# Patient Record
Sex: Female | Born: 1935 | Race: White | Hispanic: No | Smoking: Never smoker
Health system: Southern US, Community
[De-identification: ages and names within clinical notes are randomized; demographics above are authoritative.]

## PROBLEM LIST (undated history)

## (undated) DIAGNOSIS — G35 Multiple sclerosis: Secondary | ICD-10-CM

## (undated) DIAGNOSIS — R531 Weakness: Secondary | ICD-10-CM

## (undated) DIAGNOSIS — K741 Hepatic sclerosis: Secondary | ICD-10-CM

## (undated) DIAGNOSIS — E079 Disorder of thyroid, unspecified: Secondary | ICD-10-CM

## (undated) DIAGNOSIS — J45909 Unspecified asthma, uncomplicated: Secondary | ICD-10-CM

---

## 2010-08-26 ENCOUNTER — Emergency Department (HOSPITAL_BASED_OUTPATIENT_CLINIC_OR_DEPARTMENT_OTHER)
Admission: EM | Admit: 2010-08-26 | Discharge: 2010-08-26 | Payer: Self-pay | Source: Home / Self Care | Admitting: Emergency Medicine

## 2010-08-30 ENCOUNTER — Emergency Department (HOSPITAL_BASED_OUTPATIENT_CLINIC_OR_DEPARTMENT_OTHER)
Admission: EM | Admit: 2010-08-30 | Discharge: 2010-08-30 | Payer: Self-pay | Source: Home / Self Care | Admitting: Emergency Medicine

## 2012-05-05 ENCOUNTER — Emergency Department (HOSPITAL_BASED_OUTPATIENT_CLINIC_OR_DEPARTMENT_OTHER)
Admission: EM | Admit: 2012-05-05 | Discharge: 2012-05-05 | Disposition: A | Discharge status: Skilled Nursing Facility | Payer: Medicare Other | Attending: Emergency Medicine | Admitting: Emergency Medicine

## 2012-05-05 ENCOUNTER — Encounter (HOSPITAL_BASED_OUTPATIENT_CLINIC_OR_DEPARTMENT_OTHER): Payer: Self-pay | Admitting: *Deleted

## 2012-05-05 DIAGNOSIS — Z888 Allergy status to other drugs, medicaments and biological substances status: Secondary | ICD-10-CM | POA: Insufficient documentation

## 2012-05-05 DIAGNOSIS — T8389XA Other specified complication of genitourinary prosthetic devices, implants and grafts, initial encounter: Secondary | ICD-10-CM | POA: Insufficient documentation

## 2012-05-05 DIAGNOSIS — J45909 Unspecified asthma, uncomplicated: Secondary | ICD-10-CM | POA: Insufficient documentation

## 2012-05-05 DIAGNOSIS — Z882 Allergy status to sulfonamides status: Secondary | ICD-10-CM | POA: Insufficient documentation

## 2012-05-05 DIAGNOSIS — Z881 Allergy status to other antibiotic agents status: Secondary | ICD-10-CM | POA: Insufficient documentation

## 2012-05-05 DIAGNOSIS — T839XXA Unspecified complication of genitourinary prosthetic device, implant and graft, initial encounter: Secondary | ICD-10-CM

## 2012-05-05 DIAGNOSIS — G35 Multiple sclerosis: Secondary | ICD-10-CM | POA: Insufficient documentation

## 2012-05-05 HISTORY — DX: Hepatic sclerosis: K74.1

## 2012-05-05 HISTORY — DX: Weakness: R53.1

## 2012-05-05 HISTORY — DX: Unspecified asthma, uncomplicated: J45.909

## 2012-05-05 HISTORY — DX: Multiple sclerosis: G35

## 2012-05-05 HISTORY — DX: Disorder of thyroid, unspecified: E07.9

## 2012-05-05 LAB — URINALYSIS, MICROSCOPIC ONLY
Bilirubin Urine: NEGATIVE
Hgb urine dipstick: NEGATIVE
Nitrite: NEGATIVE
Protein, ur: NEGATIVE mg/dL
Urobilinogen, UA: 0.2 mg/dL (ref 0.0–1.0)

## 2012-05-05 NOTE — ED Notes (Addendum)
PT RESTING QUIETLY WITH NO C/O.

## 2012-05-05 NOTE — ED Provider Notes (Signed)
History     CSN: [id number]  Arrival date & time 05/05/12  1208   First MD Initiated Contact with Patient 05/05/12 1237      Chief Complaint  Patient presents with  . foley cath change, no c/o     (Consider location/radiation/quality/duration/timing/severity/associated sxs/prior treatment) HPI Pt presenting for change of her chronic indwelling foley catheter.  She has no symptoms.  No fever, no dificulty in drainage of urine.  However she states that her catheter has been in place for approx 2 months and has not been changed.  She states she had a recent hospitalization and got off track with home health who changes the catheter normally.  There are no other associated systemic symptoms, there are no other alleviating or modifying factors.   Past Medical History  Diagnosis Date  . Multiple sclerosis   . Hepatic sclerosis   . Weakness generalized   . Thyroid disease   . Asthma     History reviewed. No pertinent past surgical history.  History reviewed. No pertinent family history.  History  Substance Use Topics  . Smoking status: Never Smoker   . Smokeless tobacco: Not on file  . Alcohol Use: No    OB History    Grav Para Term Preterm Abortions TAB SAB Ect Mult Living                  Review of Systems ROS reviewed and all otherwise negative except for mentioned in HPI  Allergies  Macrobid; Septra; Sulfa antibiotics; Tetracyclines & related; and Tolmetin  Home Medications   Current Outpatient Rx  Name Route Sig Dispense Refill  . ALBUTEROL SULFATE HFA 108 (90 BASE) MCG/ACT IN AERS Inhalation Inhale 2 puffs into the lungs every 6 (six) hours as needed.    . AMIODARONE HCL 200 MG PO TABS Oral Take 200 mg by mouth daily.    . CELECOXIB 200 MG PO CAPS Oral Take 200 mg by mouth 2 (two) times daily.    Marland Kitchen DOCUSATE SODIUM 100 MG PO CAPS Oral Take 100 mg by mouth 2 (two) times daily.    Marland Kitchen FLUTICASONE PROPIONATE 50 MCG/ACT NA SUSP Nasal Place 2 sprays into the nose  daily.    . GUAIFENESIN 100 MG/5ML PO SYRP Oral Take 200 mg by mouth 3 (three) times daily as needed.    Marland Kitchen LEVOTHYROXINE SODIUM 100 MCG PO TABS Oral Take 100 mcg by mouth daily.    Marland Kitchen LEVOTHYROXINE SODIUM 75 MCG PO TABS Oral Take 75 mcg by mouth daily.    Marland Kitchen LORAZEPAM 0.5 MG PO TABS Oral Take 0.5 mg by mouth every 8 (eight) hours.    Marland Kitchen MIRTAZAPINE 30 MG PO TABS Oral Take 30 mg by mouth at bedtime.    Marland Kitchen MONTELUKAST SODIUM 10 MG PO TABS Oral Take 10 mg by mouth at bedtime.    . NYSTATIN 100000 UNIT/ML MT SUSP Oral Take 500,000 Units by mouth 4 (four) times daily.    Marland Kitchen OMEPRAZOLE 20 MG PO CPDR Oral Take 20 mg by mouth daily.    . OXYCODONE-ACETAMINOPHEN 5-325 MG PO TABS Oral Take 1 tablet by mouth every 4 (four) hours as needed.    . TRIAMTERENE-HCTZ 37.5-25 MG PO TABS Oral Take 1 tablet by mouth daily.    . WARFARIN SODIUM 3 MG PO TABS Oral Take 3 mg by mouth daily.      BP 114/68  Pulse 77  Temp 97.5 F (36.4 C) (Oral)  Resp 18  SpO2  98% Vitals reviewed Physical Exam Physical Examination: General appearance - alert, well appearing, and in no distress Mental status - alert, oriented to person, place, and time Chest - clear to auscultation, no wheezes, rales or rhonchi, symmetric air entry Heart - normal rate, regular rhythm, normal S1, S2, no murmurs, rubs, clicks or gallops Abdomen - soft, nontender, nondistended, no masses or organomegaly Genital- foley catheter in place, draining clear yellow urine  ED Course  Procedures (including critical care time)  Labs Reviewed  URINALYSIS, MICROSCOPIC ONLY - Abnormal; Notable for the following:    Leukocytes, UA SMALL (*)     Squamous Epithelial / LPF FEW (*)     All other components within normal limits  URINE CULTURE   No results found.   1. Foley catheter problem       MDM  Pt presenting with chronic indwelling catheter requesting that it needs to be changes as it has been in place for 2 months and should be changed monthly.   Urinalysis and urine culture sent.  Foley catheter changed without incident.  Discharged with strict return precautions.  Pt agreeable with plan.        Ethelda Chick, MD 05/11/12 1537

## 2012-05-05 NOTE — ED Notes (Signed)
Taryn, EMT unable to place foley x 2 attempts. Pt tolerated well.

## 2012-05-05 NOTE — ED Notes (Signed)
Pt to room 3 by ems via stretcher. Pt reports she is one month overdue for her foley change and the nurse at [facility] was unable to do it. Pt is awake and alert, denies any c/o. Foley is patent, putting out qs of clear yellow urine.

## 2012-05-08 LAB — URINE CULTURE: Colony Count: 35000

## 2012-05-09 NOTE — ED Notes (Signed)
+   Urine Chart sent to EDP office for review. 

## 2012-05-10 NOTE — ED Notes (Signed)
Chart returned from EDP office. No treatment needed at this time. Follow-up with her urologist. Reviewed by Roxy Horseman PA-C.

## 2012-08-29 ENCOUNTER — Encounter (HOSPITAL_BASED_OUTPATIENT_CLINIC_OR_DEPARTMENT_OTHER): Payer: Self-pay | Admitting: Emergency Medicine

## 2012-08-29 ENCOUNTER — Emergency Department (HOSPITAL_BASED_OUTPATIENT_CLINIC_OR_DEPARTMENT_OTHER)
Admission: EM | Admit: 2012-08-29 | Discharge: 2012-08-29 | Disposition: A | Discharge status: Home / Self Care | Payer: Medicare Other | Attending: Emergency Medicine | Admitting: Emergency Medicine

## 2012-08-29 DIAGNOSIS — G35 Multiple sclerosis: Secondary | ICD-10-CM | POA: Insufficient documentation

## 2012-08-29 DIAGNOSIS — T8389XA Other specified complication of genitourinary prosthetic devices, implants and grafts, initial encounter: Secondary | ICD-10-CM | POA: Insufficient documentation

## 2012-08-29 DIAGNOSIS — Z593 Problems related to living in residential institution: Secondary | ICD-10-CM | POA: Insufficient documentation

## 2012-08-29 DIAGNOSIS — N390 Urinary tract infection, site not specified: Secondary | ICD-10-CM | POA: Insufficient documentation

## 2012-08-29 DIAGNOSIS — J45909 Unspecified asthma, uncomplicated: Secondary | ICD-10-CM | POA: Insufficient documentation

## 2012-08-29 DIAGNOSIS — IMO0002 Reserved for concepts with insufficient information to code with codable children: Secondary | ICD-10-CM | POA: Insufficient documentation

## 2012-08-29 DIAGNOSIS — T839XXA Unspecified complication of genitourinary prosthetic device, implant and graft, initial encounter: Secondary | ICD-10-CM

## 2012-08-29 DIAGNOSIS — Z7901 Long term (current) use of anticoagulants: Secondary | ICD-10-CM | POA: Insufficient documentation

## 2012-08-29 DIAGNOSIS — Y838 Other surgical procedures as the cause of abnormal reaction of the patient, or of later complication, without mention of misadventure at the time of the procedure: Secondary | ICD-10-CM | POA: Insufficient documentation

## 2012-08-29 DIAGNOSIS — Z789 Other specified health status: Secondary | ICD-10-CM | POA: Insufficient documentation

## 2012-08-29 DIAGNOSIS — E079 Disorder of thyroid, unspecified: Secondary | ICD-10-CM | POA: Insufficient documentation

## 2012-08-29 DIAGNOSIS — K769 Liver disease, unspecified: Secondary | ICD-10-CM | POA: Insufficient documentation

## 2012-08-29 DIAGNOSIS — Z79899 Other long term (current) drug therapy: Secondary | ICD-10-CM | POA: Insufficient documentation

## 2012-08-29 LAB — URINALYSIS, MICROSCOPIC ONLY
Bilirubin Urine: NEGATIVE
Glucose, UA: NEGATIVE mg/dL
Ketones, ur: NEGATIVE mg/dL
pH: 6 (ref 5.0–8.0)

## 2012-08-29 MED ORDER — CEPHALEXIN 500 MG PO CAPS: 500.0000 mg | ORAL_CAPSULE | Freq: Four times a day (QID) | ORAL | Status: AC

## 2012-08-29 NOTE — ED Notes (Signed)
Sent here from nursing facility because foley came out

## 2012-08-29 NOTE — ED Provider Notes (Signed)
History     CSN: [id number]  Arrival date & time 08/29/12  0226   First MD Initiated Contact with Patient 08/29/12 [phone]      Chief Complaint  Patient presents with  . Urinary Retention    (Consider location/radiation/quality/duration/timing/severity/associated sxs/prior treatment) HPI Pt presenting from nursing home due to foley coming out.  Pt has chronic indwelling foley and states that it is replaced once per month.  She states it was replaced earlier today and she felt that it was not in properly.  It began leaking and then came out tonight.  She does feel the need to urinate.  No fever/chills, no vomiting, no abdominal pain.  She has no other complaints.  There are no other associated systemic symptoms, there are no other alleviating or modifying factors.   Past Medical History  Diagnosis Date  . Multiple sclerosis   . Hepatic sclerosis   . Weakness generalized   . Thyroid disease   . Asthma     History reviewed. No pertinent past surgical history.  No family history on file.  History  Substance Use Topics  . Smoking status: Never Smoker   . Smokeless tobacco: Not on file  . Alcohol Use: No    OB History    Grav Para Term Preterm Abortions TAB SAB Ect Mult Living                  Review of Systems ROS reviewed and all otherwise negative except for mentioned in HPI  Allergies  Macrobid; Septra; Sulfa antibiotics; Tetracyclines & related; and Tolmetin  Home Medications   Current Outpatient Rx  Name  Route  Sig  Dispense  Refill  . ALBUTEROL SULFATE HFA 108 (90 BASE) MCG/ACT IN AERS   Inhalation   Inhale 2 puffs into the lungs every 6 (six) hours as needed.         . AMIODARONE HCL 200 MG PO TABS   Oral   Take 200 mg by mouth daily.         . CELECOXIB 200 MG PO CAPS   Oral   Take 200 mg by mouth 2 (two) times daily.         . CEPHALEXIN 500 MG PO CAPS   Oral   Take 1 capsule (500 mg total) by mouth 4 (four) times daily.   40 capsule   0   . DOCUSATE SODIUM 100 MG PO CAPS   Oral   Take 100 mg by mouth 2 (two) times daily.         Marland Kitchen FLUTICASONE PROPIONATE 50 MCG/ACT NA SUSP   Nasal   Place 2 sprays into the nose daily.         . GUAIFENESIN 100 MG/5ML PO SYRP   Oral   Take 200 mg by mouth 3 (three) times daily as needed.         Marland Kitchen LEVOTHYROXINE SODIUM 100 MCG PO TABS   Oral   Take 100 mcg by mouth daily.         Marland Kitchen LEVOTHYROXINE SODIUM 75 MCG PO TABS   Oral   Take 75 mcg by mouth daily.         Marland Kitchen LORAZEPAM 0.5 MG PO TABS   Oral   Take 0.5 mg by mouth every 8 (eight) hours.         Marland Kitchen MIRTAZAPINE 30 MG PO TABS   Oral   Take 30 mg by mouth at bedtime.         Marland Kitchen  MONTELUKAST SODIUM 10 MG PO TABS   Oral   Take 10 mg by mouth at bedtime.         . NYSTATIN 100000 UNIT/ML MT SUSP   Oral   Take 500,000 Units by mouth 4 (four) times daily.         Marland Kitchen OMEPRAZOLE 20 MG PO CPDR   Oral   Take 20 mg by mouth daily.         . OXYCODONE-ACETAMINOPHEN 5-325 MG PO TABS   Oral   Take 1 tablet by mouth every 4 (four) hours as needed.         . TRIAMTERENE-HCTZ 37.5-25 MG PO TABS   Oral   Take 1 tablet by mouth daily.         . WARFARIN SODIUM 3 MG PO TABS   Oral   Take 3 mg by mouth daily.           BP 119/59  Pulse 74  Temp 98.3 F (36.8 C) (Oral)  Resp 18  SpO2 94% Vitals reviewed Physical Exam Physical Examination: General appearance - alert, well appearing, and in no distress Mental status - alert, oriented to person, place, and time Mouth - mucous membranes moist, pharynx normal without lesions Chest - clear to auscultation, no wheezes, rales or rhonchi, symmetric air entry Heart - normal rate, regular rhythm, normal S1, S2, no murmurs, rubs, clicks or gallops Abdomen - soft, nontender, nondistended, no masses or organomegaly Extremities - peripheral pulses normal, no pedal edema, no clubbing or cyanosis Skin - normal coloration and turgor, no rashes, no suspicious skin  lesions noted  ED Course  Procedures (including critical care time)  Labs Reviewed  URINALYSIS, MICROSCOPIC ONLY - Abnormal; Notable for the following:    Nitrite POSITIVE (*)     Leukocytes, UA MODERATE (*)     Bacteria, UA MANY (*)     All other components within normal limits  URINE CULTURE   No results found.   1. Foley catheter problem   2. Urinary tract infection       MDM  Pt presenting with foley having come out.  She has no current symptoms.  Urinalysis and culture sent.  Urine is positive for nitrites- will start on keflex.  Foley was replaced.  Discharged with strict return precautions.  Pt agreeable with plan.        Ethelda Chick, MD 08/29/12 [phone]

## 2012-08-30 LAB — URINE CULTURE

## 2012-09-08 ENCOUNTER — Encounter (HOSPITAL_BASED_OUTPATIENT_CLINIC_OR_DEPARTMENT_OTHER): Payer: Self-pay | Admitting: Emergency Medicine

## 2012-09-08 ENCOUNTER — Emergency Department (HOSPITAL_BASED_OUTPATIENT_CLINIC_OR_DEPARTMENT_OTHER)
Admission: EM | Admit: 2012-09-08 | Discharge: 2012-09-08 | Disposition: A | Discharge status: Skilled Nursing Facility | Payer: Medicare Other | Attending: Emergency Medicine | Admitting: Emergency Medicine

## 2012-09-08 DIAGNOSIS — Z8719 Personal history of other diseases of the digestive system: Secondary | ICD-10-CM | POA: Insufficient documentation

## 2012-09-08 DIAGNOSIS — Y838 Other surgical procedures as the cause of abnormal reaction of the patient, or of later complication, without mention of misadventure at the time of the procedure: Secondary | ICD-10-CM | POA: Insufficient documentation

## 2012-09-08 DIAGNOSIS — J45909 Unspecified asthma, uncomplicated: Secondary | ICD-10-CM | POA: Insufficient documentation

## 2012-09-08 DIAGNOSIS — Z7901 Long term (current) use of anticoagulants: Secondary | ICD-10-CM | POA: Insufficient documentation

## 2012-09-08 DIAGNOSIS — T839XXA Unspecified complication of genitourinary prosthetic device, implant and graft, initial encounter: Secondary | ICD-10-CM

## 2012-09-08 DIAGNOSIS — T83091A Other mechanical complication of indwelling urethral catheter, initial encounter: Secondary | ICD-10-CM | POA: Insufficient documentation

## 2012-09-08 DIAGNOSIS — Z79899 Other long term (current) drug therapy: Secondary | ICD-10-CM | POA: Insufficient documentation

## 2012-09-08 DIAGNOSIS — Z593 Problems related to living in residential institution: Secondary | ICD-10-CM | POA: Insufficient documentation

## 2012-09-08 DIAGNOSIS — Z789 Other specified health status: Secondary | ICD-10-CM | POA: Insufficient documentation

## 2012-09-08 DIAGNOSIS — G35 Multiple sclerosis: Secondary | ICD-10-CM | POA: Insufficient documentation

## 2012-09-08 DIAGNOSIS — E079 Disorder of thyroid, unspecified: Secondary | ICD-10-CM | POA: Insufficient documentation

## 2012-09-08 LAB — URINALYSIS, MICROSCOPIC ONLY
Bilirubin Urine: NEGATIVE
Glucose, UA: NEGATIVE mg/dL
Ketones, ur: NEGATIVE mg/dL
pH: 5 (ref 5.0–8.0)

## 2012-09-08 NOTE — ED Notes (Signed)
Reports foley cath came out denies pull or incident that dislodged

## 2012-09-08 NOTE — ED Notes (Signed)
MD at bedside. 

## 2012-09-08 NOTE — ED Notes (Signed)
Call placed for pt transport to [facility].

## 2012-09-08 NOTE — ED Provider Notes (Signed)
History     CSN: [id number]  Arrival date & time 09/08/12  0103   First MD Initiated Contact with Patient 09/08/12 0122      Chief Complaint  Patient presents with  . Illegal value: [    Foley catheter dislodge     HPI Foley catheter problem Onset - just prior to arrival Course - stable Improved by - nothing Worsened by - nothing  Pt reports she was getting up to have bowel movement and her foley catheter dislodged.  She denies abd pain . She has no new weakness (she has h/o MS) She denies fever/vomiting.  She denies any hematuria She reports she has indwelling catheter for multiple sclerosis  Past Medical History  Diagnosis Date  . Multiple sclerosis   . Hepatic sclerosis   . Weakness generalized   . Thyroid disease   . Asthma     History reviewed. No pertinent past surgical history.  History reviewed. No pertinent family history.  History  Substance Use Topics  . Smoking status: Never Smoker   . Smokeless tobacco: Not on file  . Alcohol Use: No    OB History    Grav Para Term Preterm Abortions TAB SAB Ect Mult Living                  Review of Systems  Constitutional: Negative for fever.  Gastrointestinal: Negative for vomiting.    Allergies  Macrobid; Septra; Sulfa antibiotics; Tetracyclines & related; and Tolmetin  Home Medications   Current Outpatient Rx  Name  Route  Sig  Dispense  Refill  . ALBUTEROL SULFATE HFA 108 (90 BASE) MCG/ACT IN AERS   Inhalation   Inhale 2 puffs into the lungs every 6 (six) hours as needed.         . AMIODARONE HCL 200 MG PO TABS   Oral   Take 200 mg by mouth daily.         . CELECOXIB 200 MG PO CAPS   Oral   Take 200 mg by mouth 2 (two) times daily.         . CEPHALEXIN 500 MG PO CAPS   Oral   Take 1 capsule (500 mg total) by mouth 4 (four) times daily.   40 capsule   0   . DOCUSATE SODIUM 100 MG PO CAPS   Oral   Take 100 mg by mouth 2 (two) times daily.         Marland Kitchen FLUTICASONE PROPIONATE 50  MCG/ACT NA SUSP   Nasal   Place 2 sprays into the nose daily.         . GUAIFENESIN 100 MG/5ML PO SYRP   Oral   Take 200 mg by mouth 3 (three) times daily as needed.         Marland Kitchen LEVOTHYROXINE SODIUM 100 MCG PO TABS   Oral   Take 100 mcg by mouth daily.         Marland Kitchen LEVOTHYROXINE SODIUM 75 MCG PO TABS   Oral   Take 75 mcg by mouth daily.         Marland Kitchen LORAZEPAM 0.5 MG PO TABS   Oral   Take 0.5 mg by mouth every 8 (eight) hours.         Marland Kitchen MIRTAZAPINE 30 MG PO TABS   Oral   Take 30 mg by mouth at bedtime.         Marland Kitchen MONTELUKAST SODIUM 10 MG PO TABS   Oral  Take 10 mg by mouth at bedtime.         . NYSTATIN 100000 UNIT/ML MT SUSP   Oral   Take 500,000 Units by mouth 4 (four) times daily.         Marland Kitchen OMEPRAZOLE 20 MG PO CPDR   Oral   Take 20 mg by mouth daily.         . OXYCODONE-ACETAMINOPHEN 5-325 MG PO TABS   Oral   Take 1 tablet by mouth every 4 (four) hours as needed.         . TRIAMTERENE-HCTZ 37.5-25 MG PO TABS   Oral   Take 1 tablet by mouth daily.         . WARFARIN SODIUM 3 MG PO TABS   Oral   Take 3 mg by mouth daily.           BP 127/64  Pulse 76  Temp 98.7 F (37.1 C) (Oral)  Resp 20  SpO2 98%  Physical Exam CONSTITUTIONAL: Well developed/well nourished HEAD AND FACE: Normocephalic/atraumatic EYES: EOMI/PERRL ENMT: Mucous membranes moist NECK: supple no meningeal signs LUNGS:  no apparent distress ABDOMEN: soft, nontender, no rebound or guarding NEURO: Pt is awake/alert, moves all extremitiesx4 EXTREMITIES: pulses normal, full ROM SKIN: warm, color normal PSYCH: no abnormalities of mood noted  ED Course  Procedures   Labs Reviewed  URINALYSIS, MICROSCOPIC ONLY - Abnormal; Notable for the following:    Leukocytes, UA TRACE (*)     Squamous Epithelial / LPF FEW (*)     All other components within normal limits  URINE CULTURE   No results found.   1. Foley catheter problem    Nurse replaced foley cathetr.  Urine  sent for culture.  She is already on keflex.  Defer further treamtentt Stable for d/c  MDM  Nursing notes including past medical history and social history reviewed and considered in documentation Labs/vital reviewed and considered Previous records reviewed and considered - recent ED visit for foley catheter displacement         Joya Gaskins, MD 09/08/12 [phone]

## 2012-09-08 NOTE — ED Notes (Signed)
Pt has foley catheter as result of MS catheter came dislodged needs replaced

## 2012-09-09 LAB — URINE CULTURE

## 2014-09-30 ENCOUNTER — Emergency Department (HOSPITAL_BASED_OUTPATIENT_CLINIC_OR_DEPARTMENT_OTHER): Payer: Medicare Other

## 2014-09-30 ENCOUNTER — Emergency Department (HOSPITAL_BASED_OUTPATIENT_CLINIC_OR_DEPARTMENT_OTHER)
Admission: EM | Admit: 2014-09-30 | Discharge: 2014-09-30 | Disposition: A | Discharge status: Other Acute Inpatient Hospital | Payer: Medicare Other | Attending: Emergency Medicine | Admitting: Emergency Medicine

## 2014-09-30 ENCOUNTER — Encounter (HOSPITAL_BASED_OUTPATIENT_CLINIC_OR_DEPARTMENT_OTHER): Payer: Self-pay

## 2014-09-30 DIAGNOSIS — R4182 Altered mental status, unspecified: Secondary | ICD-10-CM | POA: Insufficient documentation

## 2014-09-30 DIAGNOSIS — I959 Hypotension, unspecified: Secondary | ICD-10-CM | POA: Insufficient documentation

## 2014-09-30 DIAGNOSIS — Z7901 Long term (current) use of anticoagulants: Secondary | ICD-10-CM | POA: Insufficient documentation

## 2014-09-30 DIAGNOSIS — N19 Unspecified kidney failure: Secondary | ICD-10-CM | POA: Insufficient documentation

## 2014-09-30 DIAGNOSIS — R404 Transient alteration of awareness: Secondary | ICD-10-CM

## 2014-09-30 DIAGNOSIS — E079 Disorder of thyroid, unspecified: Secondary | ICD-10-CM | POA: Diagnosis not present

## 2014-09-30 DIAGNOSIS — Z7951 Long term (current) use of inhaled steroids: Secondary | ICD-10-CM | POA: Insufficient documentation

## 2014-09-30 DIAGNOSIS — R05 Cough: Secondary | ICD-10-CM | POA: Diagnosis not present

## 2014-09-30 DIAGNOSIS — Z792 Long term (current) use of antibiotics: Secondary | ICD-10-CM | POA: Diagnosis not present

## 2014-09-30 DIAGNOSIS — Z79899 Other long term (current) drug therapy: Secondary | ICD-10-CM | POA: Insufficient documentation

## 2014-09-30 DIAGNOSIS — G35 Multiple sclerosis: Secondary | ICD-10-CM | POA: Diagnosis not present

## 2014-09-30 DIAGNOSIS — R059 Cough, unspecified: Secondary | ICD-10-CM

## 2014-09-30 DIAGNOSIS — Z791 Long term (current) use of non-steroidal anti-inflammatories (NSAID): Secondary | ICD-10-CM | POA: Diagnosis not present

## 2014-09-30 DIAGNOSIS — J45909 Unspecified asthma, uncomplicated: Secondary | ICD-10-CM | POA: Diagnosis not present

## 2014-09-30 LAB — CBC WITH DIFFERENTIAL/PLATELET
Basophils Absolute: 0 10*3/uL (ref 0.0–0.1)
Basophils Relative: 0 % (ref 0–1)
Eosinophils Absolute: 0.4 10*3/uL (ref 0.0–0.7)
Eosinophils Relative: 4 % (ref 0–5)
HCT: 37.4 % (ref 36.0–46.0)
HEMOGLOBIN: 11.5 g/dL — AB (ref 12.0–15.0)
LYMPHS ABS: 1.5 10*3/uL (ref 0.7–4.0)
LYMPHS PCT: 15 % (ref 12–46)
MCH: 28.1 pg (ref 26.0–34.0)
MCHC: 30.7 g/dL (ref 30.0–36.0)
MCV: 91.4 fL (ref 78.0–100.0)
MONOS PCT: 7 % (ref 3–12)
Monocytes Absolute: 0.7 10*3/uL (ref 0.1–1.0)
NEUTROS ABS: 7.3 10*3/uL (ref 1.7–7.7)
NEUTROS PCT: 74 % (ref 43–77)
PLATELETS: 219 10*3/uL (ref 150–400)
RBC: 4.09 MIL/uL (ref 3.87–5.11)
RDW: 15.7 % — ABNORMAL HIGH (ref 11.5–15.5)
WBC: 9.9 10*3/uL (ref 4.0–10.5)

## 2014-09-30 LAB — COMPREHENSIVE METABOLIC PANEL
ALT: 10 U/L (ref 0–35)
ANION GAP: 3 — AB (ref 5–15)
AST: 20 U/L (ref 0–37)
Albumin: 3 g/dL — ABNORMAL LOW (ref 3.5–5.2)
Alkaline Phosphatase: 95 U/L (ref 39–117)
BUN: 41 mg/dL — AB (ref 6–23)
CALCIUM: 8.5 mg/dL (ref 8.4–10.5)
CO2: 26 mmol/L (ref 19–32)
CREATININE: 2.05 mg/dL — AB (ref 0.50–1.10)
Chloride: 106 mmol/L (ref 96–112)
GFR calc non Af Amer: 20 mL/min — ABNORMAL LOW (ref >90)
GFR, EST AFRICAN AMERICAN: 23 mL/min — AB (ref >90)
GLUCOSE: 88 mg/dL (ref 70–99)
Potassium: 5.2 mmol/L — ABNORMAL HIGH (ref 3.5–5.1)
SODIUM: 135 mmol/L (ref 135–145)
TOTAL PROTEIN: 6.4 g/dL (ref 6.0–8.3)
Total Bilirubin: 0.4 mg/dL (ref 0.3–1.2)

## 2014-09-30 LAB — URINALYSIS, ROUTINE W REFLEX MICROSCOPIC
Bilirubin Urine: NEGATIVE
GLUCOSE, UA: NEGATIVE mg/dL
KETONES UR: 15 mg/dL — AB
Nitrite: POSITIVE — AB
PROTEIN: 30 mg/dL — AB
SPECIFIC GRAVITY, URINE: 1.021 (ref 1.005–1.030)
Urobilinogen, UA: 0.2 mg/dL (ref 0.0–1.0)
pH: 5 (ref 5.0–8.0)

## 2014-09-30 LAB — URINE MICROSCOPIC-ADD ON

## 2014-09-30 LAB — PROTIME-INR
INR: 3.19 — ABNORMAL HIGH (ref 0.00–1.49)
Prothrombin Time: 32.7 seconds — ABNORMAL HIGH (ref 11.6–15.2)

## 2014-09-30 LAB — TROPONIN I

## 2014-09-30 LAB — I-STAT CG4 LACTIC ACID, ED: LACTIC ACID, VENOUS: 0.68 mmol/L (ref 0.5–2.0)

## 2014-09-30 MED ORDER — SODIUM CHLORIDE 0.9 % IV BOLUS (SEPSIS)
500.0000 mL | Freq: Once | INTRAVENOUS | Status: AC
  Administered 2014-09-30: 1000 mL via INTRAVENOUS

## 2014-09-30 MED ORDER — DEXTROSE 5 % IV SOLN
1.0000 g | INTRAVENOUS | Status: DC
  Administered 2014-09-30: 1 g via INTRAVENOUS

## 2014-09-30 MED ORDER — CEFTRIAXONE SODIUM 1 G IJ SOLR
INTRAMUSCULAR | Status: AC
Start: 2014-09-30 — End: 2014-09-30
  Filled 2014-09-30: qty 10

## 2014-09-30 MED ORDER — SODIUM CHLORIDE 0.9 % IV SOLN
INTRAVENOUS | Status: DC
Start: 2014-09-30 — End: 2014-09-30

## 2014-09-30 MED ORDER — SODIUM CHLORIDE 0.9 % IV BOLUS (SEPSIS)
250.0000 mL | Freq: Once | INTRAVENOUS | Status: AC
  Administered 2014-09-30: 1000 mL via INTRAVENOUS

## 2014-09-30 MED ORDER — SODIUM CHLORIDE 0.9 % IV SOLN: INTRAVENOUS | Status: DC

## 2014-09-30 NOTE — ED Notes (Signed)
Indwelling foley cath in place with amber urine in bag.  Swelling not bilateral hands and wrist.  Skin is dry and flakey, oral mucosa dry. Stage II decubitus on sacrum with wound dressing in place.

## 2014-09-30 NOTE — ED Notes (Signed)
Received call from Meagan, [employer] at [facility] is working on getting pt an admn bed

## 2014-09-30 NOTE — ED Notes (Signed)
MD at bedside. 

## 2014-09-30 NOTE — ED Provider Notes (Addendum)
CSN: [id number]     Arrival date & time 09/30/14  1024 History   First MD Initiated Contact with Patient 09/30/14 1028     Chief Complaint  Patient presents with  . Altered Mental Status     (Consider location/radiation/quality/duration/timing/severity/associated sxs/prior Treatment) HPI Comments: Patient here with altered mental status according to nursing home staff. Patient has not been behaving additionally does. She is on home oxygen and was found to be hypoxic there but there was a mechanicalissue with the delivery with the oxygen. Noreported history of fever, cough, urinary symptoms. According to EMS patient has become more alert during transport. Patient transported for further evaluation  Patient is a 81 y.o. female presenting with altered mental status. The history is provided by the patient, medical records and the EMS personnel. The history is limited by the condition of the patient.  Altered Mental Status   Past Medical History  Diagnosis Date  . Multiple sclerosis   . Hepatic sclerosis   . Weakness generalized   . Thyroid disease   . Asthma    History reviewed. No pertinent past surgical history. No family history on file. History  Substance Use Topics  . Smoking status: Never Smoker   . Smokeless tobacco: Not on file  . Alcohol Use: No   OB History    No data available     Review of Systems  Unable to perform ROS     Allergies  Macrobid; Septra; Sulfa antibiotics; Tetracyclines & related; and Tolmetin  Home Medications   Prior to Admission medications   Medication Sig Start Date End Date Taking? Authorizing Provider  albuterol (PROVENTIL HFA;VENTOLIN HFA) 108 (90 BASE) MCG/ACT inhaler Inhale 2 puffs into the lungs every 6 (six) hours as needed.    Historical Provider, MD  amiodarone (PACERONE) 200 MG tablet Take 200 mg by mouth daily.    Historical Provider, MD  celecoxib (CELEBREX) 200 MG capsule Take 200 mg by mouth 2 (two) times daily.     Historical Provider, MD  cephALEXin (KEFLEX) 500 MG capsule Take 1 capsule (500 mg total) by mouth 4 (four) times daily. 08/29/12   Ethelda Chick, MD  docusate sodium (COLACE) 100 MG capsule Take 100 mg by mouth 2 (two) times daily.    Historical Provider, MD  fluticasone (FLONASE) 50 MCG/ACT nasal spray Place 2 sprays into the nose daily.    Historical Provider, MD  guaifenesin (ROBITUSSIN) 100 MG/5ML syrup Take 200 mg by mouth 3 (three) times daily as needed.    Historical Provider, MD  levothyroxine (SYNTHROID, LEVOTHROID) 100 MCG tablet Take 100 mcg by mouth daily.    Historical Provider, MD  levothyroxine (SYNTHROID, LEVOTHROID) 75 MCG tablet Take 75 mcg by mouth daily.    Historical Provider, MD  LORazepam (ATIVAN) 0.5 MG tablet Take 0.5 mg by mouth every 8 (eight) hours.    Historical Provider, MD  mirtazapine (REMERON) 30 MG tablet Take 30 mg by mouth at bedtime.    Historical Provider, MD  montelukast (SINGULAIR) 10 MG tablet Take 10 mg by mouth at bedtime.    Historical Provider, MD  nystatin (MYCOSTATIN) 100000 UNIT/ML suspension Take 500,000 Units by mouth 4 (four) times daily.    Historical Provider, MD  omeprazole (PRILOSEC) 20 MG capsule Take 20 mg by mouth daily.    Historical Provider, MD  oxyCODONE-acetaminophen (PERCOCET/ROXICET) 5-325 MG per tablet Take 1 tablet by mouth every 4 (four) hours as needed.    Historical Provider, MD  triamterene-hydrochlorothiazide [facility]) 37.5-25  MG per tablet Take 1 tablet by mouth daily.    Historical Provider, MD  warfarin (COUMADIN) 3 MG tablet Take 3 mg by mouth daily.    Historical Provider, MD   BP 98/41 mmHg  Pulse 73  Temp(Src) 97.6 F (36.4 C) (Rectal)  Resp 18  Wt 150 lb (68.04 kg)  SpO2 99% Physical Exam  Constitutional: She appears well-developed and well-nourished.  Non-toxic appearance. No distress.  HENT:  Head: Normocephalic and atraumatic.  Eyes: Conjunctivae, EOM and lids are normal. Pupils are equal, round, and  reactive to light.  Neck: Normal range of motion. Neck supple. No tracheal deviation present. No thyroid mass present.  Cardiovascular: Normal rate, regular rhythm and normal heart sounds.  Exam reveals no gallop.   No murmur heard. Pulmonary/Chest: Effort normal and breath sounds normal. No stridor. No respiratory distress. She has no decreased breath sounds. She has no wheezes. She has no rhonchi. She has no rales.  Abdominal: Soft. Normal appearance and bowel sounds are normal. She exhibits no distension. There is no tenderness. There is no rebound and no CVA tenderness.  Musculoskeletal: Normal range of motion. She exhibits no edema or tenderness.  Neurological: She is alert. No cranial nerve deficit or sensory deficit. GCS eye subscore is 4. GCS verbal subscore is 4. GCS motor subscore is 6.  Upper extremity strength normal bilaterally. 3 of 5 strength noted bilateral lower extremities which is baseline according to the records  Skin: Skin is warm and dry. No abrasion and no rash noted.  Psychiatric: Her affect is blunt. Her speech is delayed. She is slowed.  Nursing note and vitals reviewed.   ED Course  Procedures (including critical care time) Labs Review Labs Reviewed  URINE CULTURE  CULTURE, BLOOD (ROUTINE X 2)  CULTURE, BLOOD (ROUTINE X 2)  CBC WITH DIFFERENTIAL/PLATELET  COMPREHENSIVE METABOLIC PANEL  URINALYSIS, ROUTINE W REFLEX MICROSCOPIC  BRAIN NATRIURETIC PEPTIDE  TROPONIN I  PROTIME-INR  I-STAT CG4 LACTIC ACID, ED    Imaging Review No results found.   EKG Interpretation None      MDM   Final diagnoses:  Cough  Altered consciousness    Patient hypotensive here and given IV fluids. Urinalysis shows infection and started on Rocephin. Blood pressure has been responsive to IV fluids. Patient has evidence of early renal failure on her Bmet. She is mentating appropriately. Will admit to [facility] regional per the family's request   2:14 PM Patient  rechecked and blood pressure on the monitor was 93/45. She is maintaining appropriate. Spoke with the hospitalist at [facility], Dr.nsiah, patient will be sent to the ER to be seen by ed physician   CRITICAL CARE Performed by: Toy Baker Total critical care time: 50 Critical care time was exclusive of separately billable procedures and treating other patients. Critical care was necessary to treat or prevent imminent or life-threatening deterioration. Critical care was time spent personally by me on the following activities: development of treatment plan with patient and/or surrogate as well as nursing, discussions with consultants, evaluation of patient's response to treatment, examination of patient, obtaining history from patient or surrogate, ordering and performing treatments and interventions, ordering and review of laboratory studies, ordering and review of radiographic studies, pulse oximetry and re-evaluation of patient's condition.    Toy Baker, MD 09/30/14 1326  Toy Baker, MD 09/30/14 [phone]

## 2014-09-30 NOTE — ED Notes (Signed)
EDP [facility] notified of BP-orders for NS 250cc bolus

## 2014-09-30 NOTE — ED Notes (Signed)
Change in LOC per nursing home staff.  States pt has a decreased appetite and not behaving normally.  PTAR reports SAO2 82% on 2L, however [place] not in place correctly.

## 2014-09-30 NOTE — ED Notes (Signed)
Pt sats dropped to 85% sleeping-mouth breathing-EDP and RRT notified-RRT at [facility] to change O2 to venti mask

## 2014-09-30 NOTE — ED Notes (Signed)
EDP notified of no change with 250cc bolus-orders for 500cc bolus

## 2014-10-02 LAB — URINE CULTURE: Colony Count: >=100000

## 2014-10-02 LAB — CULTURE, BLOOD (ROUTINE X 2)

## 2014-10-03 ENCOUNTER — Telehealth (HOSPITAL_BASED_OUTPATIENT_CLINIC_OR_DEPARTMENT_OTHER): Payer: Self-pay | Admitting: Emergency Medicine

## 2014-10-03 NOTE — Telephone Encounter (Signed)
Post ED Visit - Positive Culture Follow-up  Culture report reviewed by antimicrobial stewardship pharmacist: []  Wes Dulaney, Pharm.D., BCPS [x]  Celedonio Miyamoto, Pharm.D., BCPS []  Georgina Pillion, Pharm.D., BCPS []  [place], [address].D., BCPS, AAHIVP []  Estella Husk, Pharm.D., BCPS, AAHIVP []  Elder Cyp[place], [address].D., BCPS  Positive urine culture E.Coli Blood culture Staph Pt is inpatient @ [facility], report faxed to [phone] per PharmD request  Berle Mull 10/03/2014, 11:29 AM

## 2014-10-06 LAB — CULTURE, BLOOD (ROUTINE X 2): Culture: NO GROWTH

## 2016-03-12 DEATH — deceased

## 2016-06-01 IMAGING — CT CT HEAD W/O CM
1 series · 16 of 30 positions shown, 20 images · non-contrast
Comparison: 06/17/2014 CT.  05/24/2009 MR.

CLINICAL DATA: Altered level of consciousness of uncertain
duration. Initial encounter.

EXAM:
CT HEAD WITHOUT CONTRAST
TECHNIQUE: Contiguous axial images were obtained from the base of the skull
through the vertex without intravenous contrast.

[Series 2: head 4.8 h37s · axial · 0.42mm/px · z∈[-168,-28]mm · 16 of 32 slices shown, 20 images]
[im 2/32  brain]
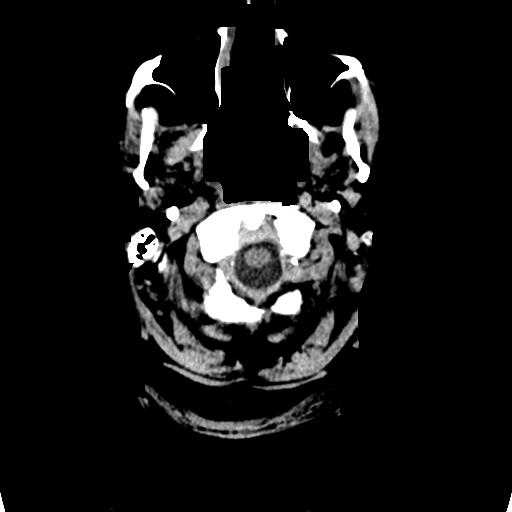
[im 2/32  bone]
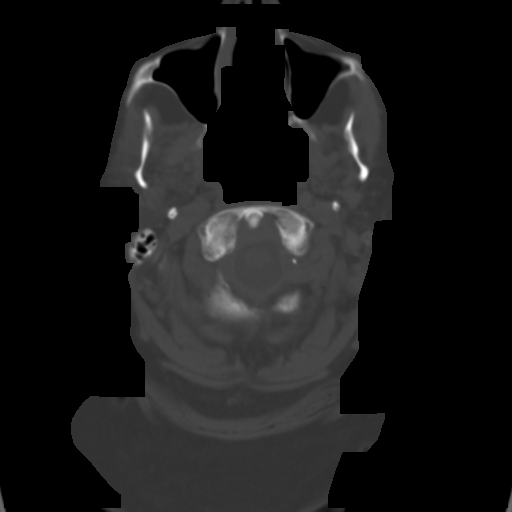
[im 4/32  brain]
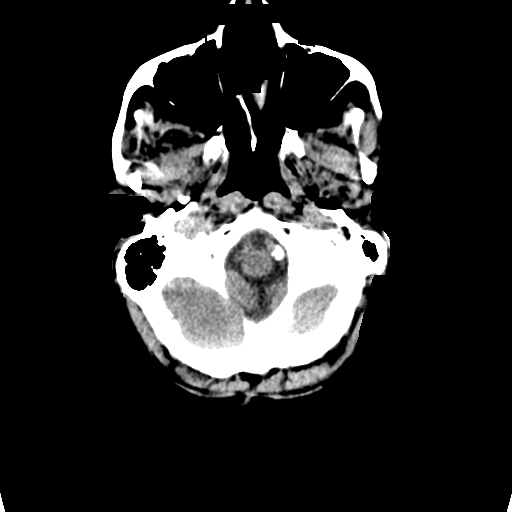
[im 6/32  brain]
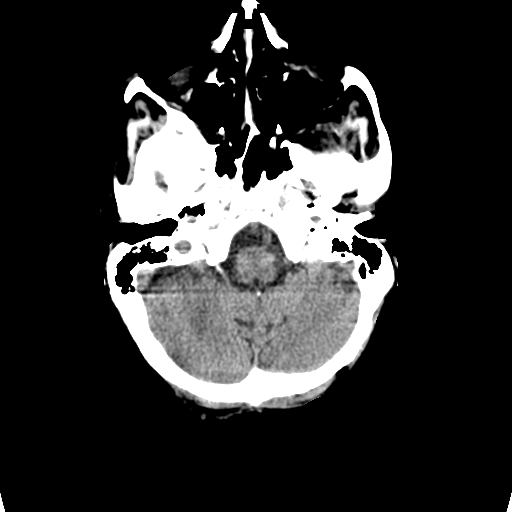
[im 8/32  brain]
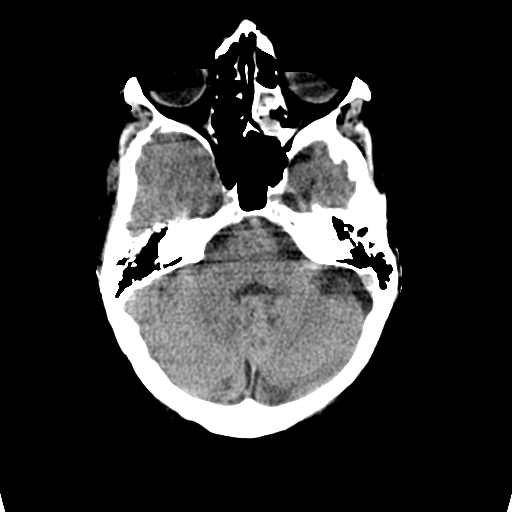
[im 9/32  brain]
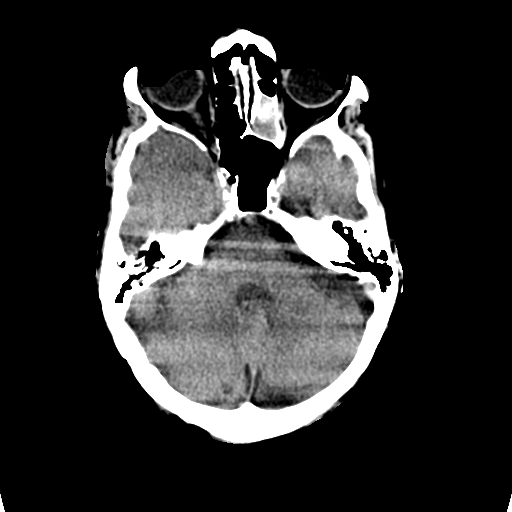
[im 9/32  bone]
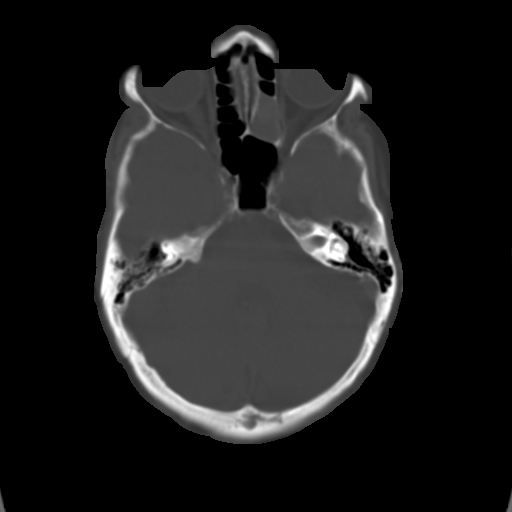
[im 11/32  brain]
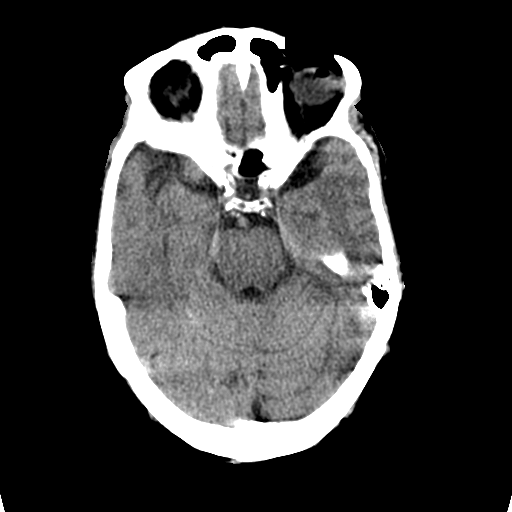
[im 13/32  brain]
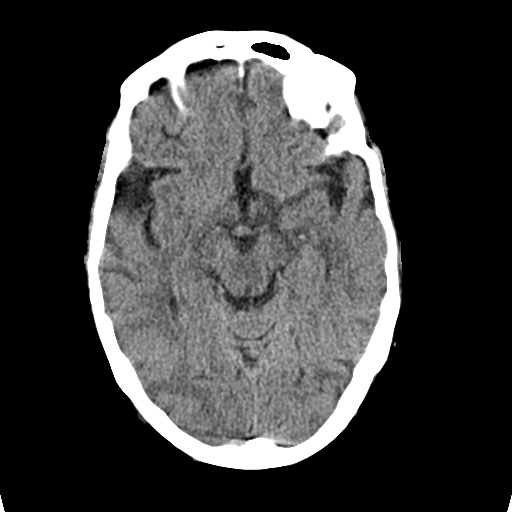
[im 15/32  brain]
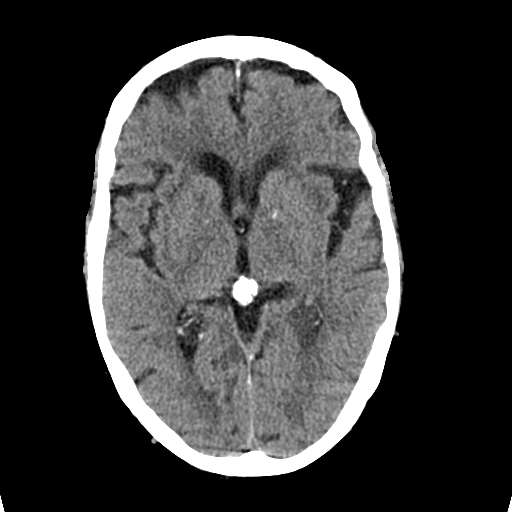
[im 17/32  brain]
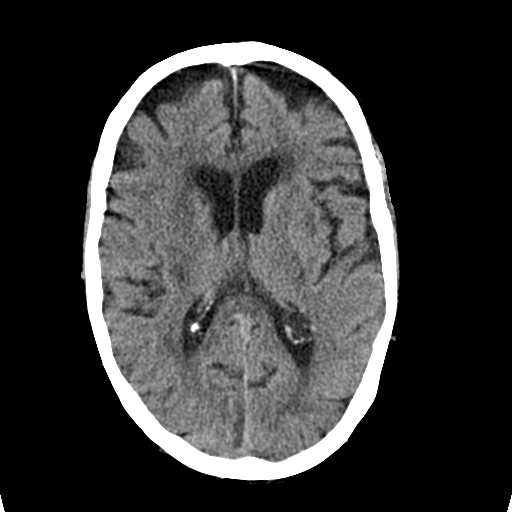
[im 17/32  bone]
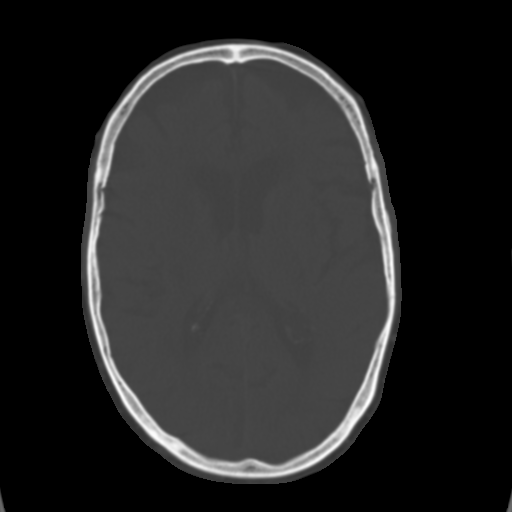
[im 19/32  brain]
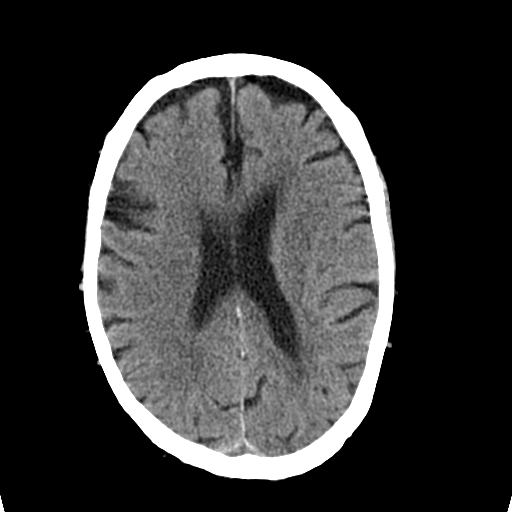
[im 21/32  brain]
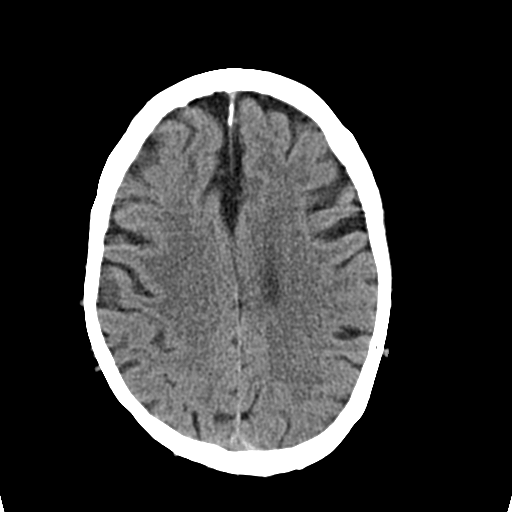
[im 23/32  brain]
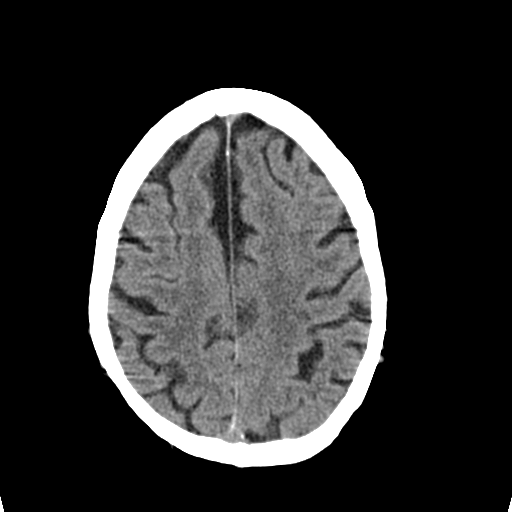
[im 24/32  brain]
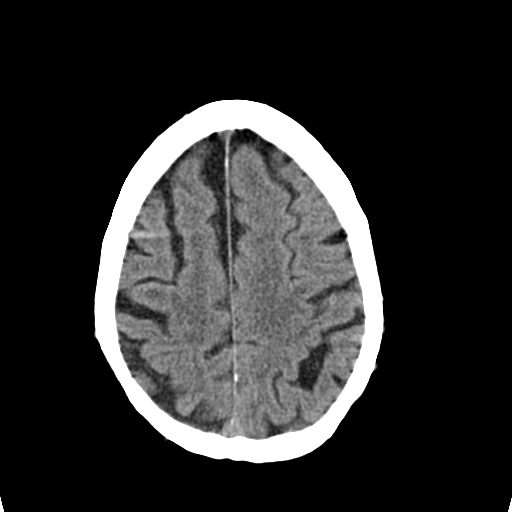
[im 24/32  bone]
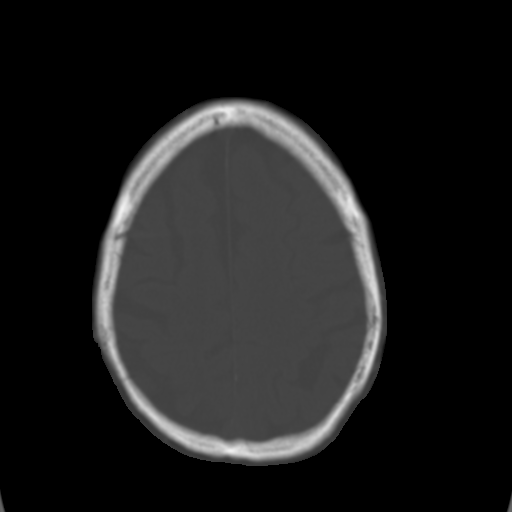
[im 26/32  brain]
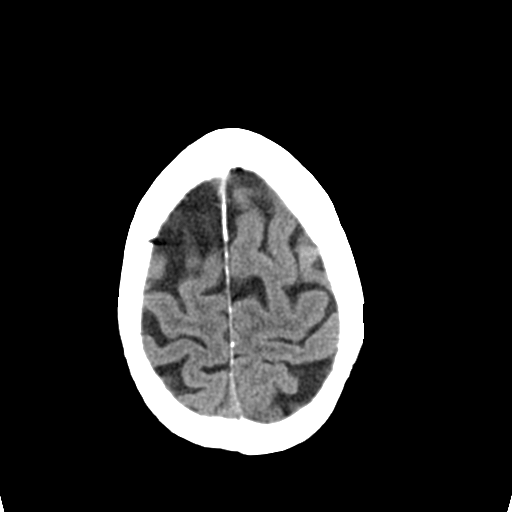
[im 28/32  brain]
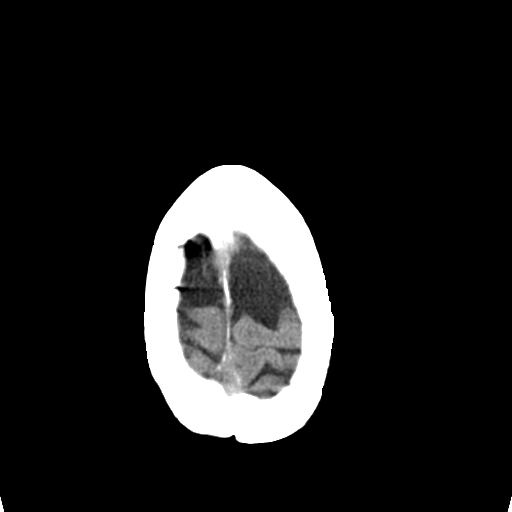
[im 30/32  brain]
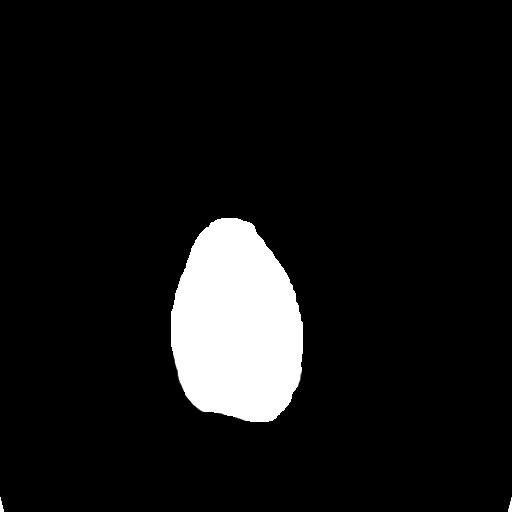

[16 of 30 positions shown; findings below may reference images not displayed]

FINDINGS: Generalized cerebral and cerebellar atrophy. Moderate chronic
microvascular ischemic change. No evidence for acute infarction,
hemorrhage, mass lesion, hydrocephalus, or extra-axial fluid.
Suspect chronic lacunar infarctions of the subinsular white matter,
RIGHT greater than LEFT. Exuberant and stable pineal calcification.

Calvarium intact. Chronic opacification of posterior ethmoid air
cells on the LEFT. Negative orbits. Negative mastoids.
IMPRESSION: Chronic changes as described.  No acute intracranial findings.
